# Patient Record
Sex: Female | Born: 1990 | Race: Black or African American | Hispanic: No | Marital: Single | Smoking: Never smoker
Health system: Southern US, Community
[De-identification: ages and names within clinical notes are randomized; demographics above are authoritative.]

## PROBLEM LIST (undated history)

## (undated) DIAGNOSIS — F410 Panic disorder [episodic paroxysmal anxiety] without agoraphobia: Secondary | ICD-10-CM

## (undated) DIAGNOSIS — J189 Pneumonia, unspecified organism: Secondary | ICD-10-CM

---
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
## 2010-09-16 LAB — COMPREHENSIVE METABOLIC PANEL
ALT: 20 U/L (ref 0–35)
Albumin: 4.3 g/dL (ref 3.5–5.2)
BUN: 13 mg/dL (ref 6–23)
Calcium: 9.7 mg/dL (ref 8.4–10.5)
Glucose, Bld: 91 mg/dL (ref 70–99)
Sodium: 136 mEq/L (ref 135–145)
Total Protein: 8.2 g/dL (ref 6.0–8.3)

## 2010-09-16 LAB — DIFFERENTIAL
Lymphs Abs: 1.3 10*3/uL (ref 0.7–4.0)
Monocytes Absolute: 0.3 10*3/uL (ref 0.1–1.0)
Monocytes Relative: 3 % (ref 3–12)
Neutro Abs: 7.6 10*3/uL (ref 1.7–7.7)
Neutrophils Relative %: 82 % — ABNORMAL HIGH (ref 43–77)

## 2010-09-16 LAB — URINALYSIS, ROUTINE W REFLEX MICROSCOPIC
Nitrite: NEGATIVE
Specific Gravity, Urine: 1.033 — ABNORMAL HIGH (ref 1.005–1.030)
pH: 6 (ref 5.0–8.0)

## 2010-09-16 LAB — URINE MICROSCOPIC-ADD ON

## 2010-09-16 LAB — CBC
Hemoglobin: 12.7 g/dL (ref 12.0–15.0)
MCHC: 32.8 g/dL (ref 30.0–36.0)
Platelets: 326 10*3/uL (ref 150–400)
RDW: 15.7 % — ABNORMAL HIGH (ref 11.5–15.5)

## 2010-09-16 LAB — PREGNANCY, URINE: Preg Test, Ur: NEGATIVE

## 2012-03-11 ENCOUNTER — Emergency Department (HOSPITAL_COMMUNITY)
Admission: EM | Admit: 2012-03-11 | Discharge: 2012-03-11 | Disposition: A | Discharge status: Home / Self Care | Payer: Self-pay | Attending: Emergency Medicine | Admitting: Emergency Medicine

## 2012-03-11 ENCOUNTER — Encounter (HOSPITAL_COMMUNITY): Payer: Self-pay | Admitting: Emergency Medicine

## 2012-03-11 DIAGNOSIS — F410 Panic disorder [episodic paroxysmal anxiety] without agoraphobia: Secondary | ICD-10-CM | POA: Insufficient documentation

## 2012-03-11 HISTORY — DX: Panic disorder (episodic paroxysmal anxiety): F41.0

## 2012-03-11 NOTE — ED Notes (Signed)
Pt lying in bed, denies stressors. Called EMS earlier today but refused transport. Took Clonipine w/o relief so called EMS again. BP - 140/72, HR - 98, Resp - 22,  O2 SAT 100%, lung sounds clear and equal, w/o other Sx. No treatment PTA

## 2012-03-11 NOTE — ED Notes (Signed)
Discharge instructions reviewed w/ pt., verbalizes understanding. No prescriptions provided at discharge. 

## 2012-03-11 NOTE — ED Notes (Signed)
JYN:[id number]<AO> Expected date:<BR> Expected time:<BR> Means of arrival:<BR> Comments:<BR> EMS-[employer] student with anxiety attack

## 2012-03-11 NOTE — ED Provider Notes (Signed)
History     CSN: [id number]  Arrival date & time 03/11/12  0001   First MD Initiated Contact with Patient 03/11/12 0051      Chief Complaint  Patient presents with  . Panic Attack     The history is provided by the patient.   patient reports she takes when necessary Klonopin for anxiety reports that she had an anxiety attack and was described as shortness of breath and some sense of feeling of doom.  She is a long-standing history of panic attacks and states this was consistent with her priors.  She didn't denies homicidal or suicidal thoughts.  She has no other complaints at this time.  She reports that by the time she arrived the emergency department she felt much better.  She is requesting change in her dosage of Klonopin.  She has not called her psychiatrist regarding her increasing anxiety attacks.  She is a currently a Archivist.  Past Medical History  Diagnosis Date  . Panic attacks     History reviewed. No pertinent past surgical history.  No family history on file.  History  Substance Use Topics  . Smoking status: Never Smoker   . Smokeless tobacco: Never Used  . Alcohol Use: Yes     wine coolers every couple weeks    OB History    Grav Para Term Preterm Abortions TAB SAB Ect Mult Living                  Review of Systems  All other systems reviewed and are negative.    Allergies  Other  Home Medications   Current Outpatient Rx  Name Route Sig Dispense Refill  . CETIRIZINE HCL 10 MG PO TABS Oral Take 10 mg by mouth daily.    Marland Kitchen CLONAZEPAM 0.5 MG PO TABS Oral Take 0.5 mg by mouth daily as needed. For anxiety    . MEDROXYPROGESTERONE ACETATE 150 MG/ML IM SUSP Intramuscular Inject 150 mg into the muscle every 3 (three) months.      BP 130/80  Pulse 89  Temp 98.2 F (36.8 C) (Oral)  Resp 12  SpO2 99%  Physical Exam  Nursing note and vitals reviewed. Constitutional: She is oriented to person, place, and time. She appears well-developed and  well-nourished. No distress.  HENT:  Head: Normocephalic and atraumatic.  Eyes: EOM are normal.  Neck: Normal range of motion.  Cardiovascular: Normal rate, regular rhythm and normal heart sounds.   Pulmonary/Chest: Effort normal and breath sounds normal.  Abdominal: Soft. She exhibits no distension. There is no tenderness.  Musculoskeletal: Normal range of motion.  Neurological: She is alert and oriented to person, place, and time.  Skin: Skin is warm and dry.  Psychiatric: She has a normal mood and affect. Her behavior is normal. Judgment and thought content normal. Her mood appears not anxious. Her affect is not angry. Her speech is not rapid and/or pressured. Cognition and memory are normal. She does not exhibit a depressed mood. She expresses no homicidal and no suicidal ideation.    ED Course  Procedures (including critical care time)  Labs Reviewed - No data to display No results found.   1. Panic attack       MDM  Appears to be another panic attack.  I recommended outpatient followup.  No SI or HI.  No indication for evaluation by psychiatry in the ER.        Lyanne Co, MD 03/11/12 [phone]

## 2012-03-11 NOTE — ED Notes (Signed)
Pt states she had a panic attack, which she has had before, Rx'ed Clonipine which she took w/o relief. States "I couldn't breath and I started shaking all over"

## 2012-05-04 ENCOUNTER — Emergency Department (HOSPITAL_COMMUNITY): Payer: BC Managed Care – PPO

## 2012-05-04 ENCOUNTER — Emergency Department (HOSPITAL_COMMUNITY)
Admission: EM | Admit: 2012-05-04 | Discharge: 2012-05-04 | Disposition: A | Discharge status: Home / Self Care | Payer: BC Managed Care – PPO | Attending: Emergency Medicine | Admitting: Emergency Medicine

## 2012-05-04 ENCOUNTER — Encounter (HOSPITAL_COMMUNITY): Payer: Self-pay | Admitting: Emergency Medicine

## 2012-05-04 DIAGNOSIS — Z79899 Other long term (current) drug therapy: Secondary | ICD-10-CM | POA: Insufficient documentation

## 2012-05-04 DIAGNOSIS — Y92009 Unspecified place in unspecified non-institutional (private) residence as the place of occurrence of the external cause: Secondary | ICD-10-CM | POA: Insufficient documentation

## 2012-05-04 DIAGNOSIS — F410 Panic disorder [episodic paroxysmal anxiety] without agoraphobia: Secondary | ICD-10-CM | POA: Insufficient documentation

## 2012-05-04 DIAGNOSIS — R296 Repeated falls: Secondary | ICD-10-CM | POA: Insufficient documentation

## 2012-05-04 DIAGNOSIS — Y9389 Activity, other specified: Secondary | ICD-10-CM | POA: Insufficient documentation

## 2012-05-04 DIAGNOSIS — S93409A Sprain of unspecified ligament of unspecified ankle, initial encounter: Secondary | ICD-10-CM

## 2012-05-04 MED ORDER — OXYCODONE-ACETAMINOPHEN 5-325 MG PO TABS
1.0000 | ORAL_TABLET | Freq: Once | ORAL | Status: AC
  Administered 2012-05-04: 1 via ORAL
  Filled 2012-05-04: qty 1

## 2012-05-04 MED ORDER — IBUPROFEN 600 MG PO TABS: 600.0000 mg | ORAL_TABLET | Freq: Four times a day (QID) | ORAL | Status: DC | PRN

## 2012-05-04 MED ORDER — IBUPROFEN 800 MG PO TABS
800.0000 mg | ORAL_TABLET | Freq: Once | ORAL | Status: AC
  Administered 2012-05-04: 800 mg via ORAL
  Filled 2012-05-04: qty 1
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
## 2012-05-04 NOTE — Progress Notes (Signed)
Orthopedic Tech Progress Note Patient Details:  Diane Vargas 06-06-1991 [id number] Ankle air cast splint applied to Left LE, patient also fitted with crutches.  Ortho Devices Type of Ortho Device: Crutches;Ankle Air splint Ortho Device/Splint Location: Left LE Ortho Device/Splint Interventions: Application   Asia R Thompson 05/04/2012, 9:50 AM
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
## 2012-09-02 ENCOUNTER — Emergency Department (HOSPITAL_COMMUNITY)
Admission: EM | Admit: 2012-09-02 | Discharge: 2012-09-02 | Disposition: A | Discharge status: Home / Self Care | Payer: Self-pay | Attending: Emergency Medicine | Admitting: Emergency Medicine

## 2012-09-02 ENCOUNTER — Emergency Department (HOSPITAL_COMMUNITY): Payer: Self-pay

## 2012-09-02 ENCOUNTER — Encounter (HOSPITAL_COMMUNITY): Payer: Self-pay | Admitting: Emergency Medicine

## 2012-09-02 DIAGNOSIS — R05 Cough: Secondary | ICD-10-CM | POA: Insufficient documentation

## 2012-09-02 DIAGNOSIS — R630 Anorexia: Secondary | ICD-10-CM | POA: Insufficient documentation

## 2012-09-02 DIAGNOSIS — Z79899 Other long term (current) drug therapy: Secondary | ICD-10-CM | POA: Insufficient documentation

## 2012-09-02 DIAGNOSIS — R062 Wheezing: Secondary | ICD-10-CM | POA: Insufficient documentation

## 2012-09-02 DIAGNOSIS — F410 Panic disorder [episodic paroxysmal anxiety] without agoraphobia: Secondary | ICD-10-CM | POA: Insufficient documentation

## 2012-09-02 DIAGNOSIS — J189 Pneumonia, unspecified organism: Secondary | ICD-10-CM | POA: Insufficient documentation

## 2012-09-02 DIAGNOSIS — R059 Cough, unspecified: Secondary | ICD-10-CM | POA: Insufficient documentation

## 2012-09-02 DIAGNOSIS — R0789 Other chest pain: Secondary | ICD-10-CM | POA: Insufficient documentation

## 2012-09-02 DIAGNOSIS — R5381 Other malaise: Secondary | ICD-10-CM | POA: Insufficient documentation

## 2012-09-02 DIAGNOSIS — R509 Fever, unspecified: Secondary | ICD-10-CM | POA: Insufficient documentation

## 2012-09-02 DIAGNOSIS — J3489 Other specified disorders of nose and nasal sinuses: Secondary | ICD-10-CM | POA: Insufficient documentation

## 2012-09-02 DIAGNOSIS — IMO0001 Reserved for inherently not codable concepts without codable children: Secondary | ICD-10-CM | POA: Insufficient documentation

## 2012-09-02 HISTORY — DX: Panic disorder (episodic paroxysmal anxiety): F41.0

## 2012-09-02 MED ORDER — AZITHROMYCIN 250 MG PO TABS: 250.0000 mg | ORAL_TABLET | Freq: Every day | ORAL | Status: DC

## 2012-09-02 MED ORDER — ALBUTEROL SULFATE (5 MG/ML) 0.5% IN NEBU
5.0000 mg | INHALATION_SOLUTION | Freq: Once | RESPIRATORY_TRACT | Status: AC
  Administered 2012-09-02: 5 mg via RESPIRATORY_TRACT

## 2012-09-02 MED ORDER — DEXAMETHASONE SODIUM PHOSPHATE 10 MG/ML IJ SOLN
10.0000 mg | Freq: Once | INTRAMUSCULAR | Status: AC
  Administered 2012-09-02: 10 mg via INTRAMUSCULAR
  Filled 2012-09-02: qty 1

## 2012-09-02 MED ORDER — ALBUTEROL SULFATE (5 MG/ML) 0.5% IN NEBU
INHALATION_SOLUTION | RESPIRATORY_TRACT | Status: AC
  Filled 2012-09-02: qty 1

## 2012-09-02 NOTE — ED Provider Notes (Signed)
I saw  the patient, reviewed the resident's note and I agree with the findings and plan.   .Face to face Exam:  General:  Awake HEENT:  Atraumatic Resp:  Normal effort Abd:  Nondistended Neuro:No focal weakness   Nelia Shi, MD 09/02/12 2055

## 2012-09-02 NOTE — ED Notes (Signed)
PT. REPORTS SOB WITH PRODUCTIVE COUGH ONSET THIS EVENING , DENIES FEVER OR CHILLS.

## 2012-09-02 NOTE — ED Provider Notes (Signed)
History     CSN: [id number]  Arrival date & time 09/02/12  1904   First MD Initiated Contact with Patient 09/02/12 1944      Chief Complaint  Patient presents with  . Shortness of Breath    (Consider location/radiation/quality/duration/timing/severity/associated sxs/prior treatment) Patient is a 22 y.o. female presenting with shortness of breath.  Shortness of Breath Associated symptoms: cough, fever and wheezing   Associated symptoms: no abdominal pain, no neck pain and no vomiting    Ms. Diane Vargas 22yo F with pmh of panic attacks presents today for SOB and cough. Pt states had a panic attack today was able to take medication and slept with relief but woke up still some SOB. SOB was not sudden onset but has been gradually getting worse for the past week. Pt has had productive cough and subjective chills, anorexia, and myalgias for past week. She took some OTC allergy medication with limited relief. Pt has no hx of pulmonary disease. Pt is not a smoker. Pt lives in the dorms with several sick contacts with similar symptoms.   Past Medical History  Diagnosis Date  . Panic attacks   . Panic attack     History reviewed. No pertinent past surgical history.  No family history on file.  History  Substance Use Topics  . Smoking status: Never Smoker   . Smokeless tobacco: Never Used  . Alcohol Use: Yes     Comment: wine coolers every couple weeks    OB History   Grav Para Term Preterm Abortions TAB SAB Ect Mult Living                  Review of Systems  Constitutional: Positive for fever and fatigue. Negative for chills.  HENT: Positive for congestion, rhinorrhea and sneezing. Negative for neck pain and sinus pressure.   Respiratory: Positive for cough, chest tightness, shortness of breath and wheezing.   Gastrointestinal: Negative for nausea, vomiting, abdominal pain and diarrhea.  Genitourinary: Negative for dysuria.  Musculoskeletal: Positive for myalgias.    Allergies   Other and Watermelon flavor  Home Medications   Current Outpatient Rx  Name  Route  Sig  Dispense  Refill  . cetirizine (ZYRTEC) 10 MG tablet   Oral   Take 10 mg by mouth daily.         . clonazePAM (KLONOPIN) 0.5 MG tablet   Oral   Take 0.5 mg by mouth daily as needed. For anxiety           BP 118/74  Pulse 69  Temp(Src) 98.2 F (36.8 C) (Oral)  Resp 18  SpO2 98%  LMP 08/18/2012  Physical Exam  Constitutional: She is oriented to person, place, and time. She appears well-developed and well-nourished. No distress.  Eyes: Conjunctivae are normal. Pupils are equal, round, and reactive to light.  Cardiovascular: Normal rate, regular rhythm and normal heart sounds.   Pulmonary/Chest: Effort normal. No respiratory distress. She has wheezes. She has no rales.  Abdominal: Soft. She exhibits no distension. There is no tenderness. There is no rebound and no guarding.  Neurological: She is alert and oriented to person, place, and time.  Skin: Skin is warm. She is not diaphoretic.    ED Course  Procedures (including critical care time)  Labs Reviewed - No data to display Dg Chest 2 View  09/02/2012  *RADIOLOGY REPORT*  Clinical Data: Shortness of breath, chest pain  CHEST - 2 VIEW  Comparison: None.  Findings: Cardiomediastinal silhouette is  unremarkable.  No acute infiltrate or pleural effusion.  No pulmonary edema.  Bony thorax is unremarkable.  IMPRESSION: No active disease.   Original Report Authenticated By: Natasha Mead, M.D.      No diagnosis found.    MDM  Pt presented with wheezes and given nebulizer treatments with some improvement but still some wheezes on exam. Pt was never hypoxic. PERC score of zero. CXR no acute cardiopulmonary process. Pt sister had diagnosis of CAP with similar symptoms. Pt was given decadron 10mg  while in ED and d/c with Zpak.   Pt was seen and discussed with Dr. Linna Darner, MD 09/02/12 2039

## 2012-09-03 ENCOUNTER — Telehealth (HOSPITAL_COMMUNITY): Payer: Self-pay | Admitting: Emergency Medicine

## 2012-09-06 ENCOUNTER — Emergency Department (HOSPITAL_COMMUNITY)
Admission: EM | Admit: 2012-09-06 | Discharge: 2012-09-06 | Disposition: A | Discharge status: Home / Self Care | Payer: BC Managed Care – PPO | Source: Home / Self Care | Attending: Family Medicine | Admitting: Family Medicine

## 2012-09-06 ENCOUNTER — Encounter (HOSPITAL_COMMUNITY): Payer: Self-pay | Admitting: *Deleted

## 2012-09-06 DIAGNOSIS — H109 Unspecified conjunctivitis: Secondary | ICD-10-CM

## 2012-09-06 HISTORY — DX: Pneumonia, unspecified organism: J18.9

## 2012-09-06 MED ORDER — CETIRIZINE HCL 10 MG PO TABS: 10.0000 mg | ORAL_TABLET | Freq: Every day | ORAL | Status: DC

## 2012-09-06 MED ORDER — TOBRAMYCIN 0.3 % OP SOLN: 1.0000 [drp] | Freq: Four times a day (QID) | OPHTHALMIC | Status: DC

## 2012-09-06 NOTE — ED Provider Notes (Signed)
History     CSN: [id number]  Arrival date & time 09/06/12  1128   First MD Initiated Contact with Patient 09/06/12 1131      Chief Complaint  Patient presents with  . Conjunctivitis    (Consider location/radiation/quality/duration/timing/severity/associated sxs/prior treatment) Patient is a 22 y.o. female presenting with conjunctivitis. The history is provided by the patient.  Conjunctivitis  The current episode started 2 days ago. The onset was gradual. The problem has been gradually worsening. The problem is mild. Associated symptoms include eye itching, congestion, eye discharge and eye redness. Pertinent negatives include no fever, no decreased vision, no photophobia and no eye pain.    Past Medical History  Diagnosis Date  . Panic attacks   . Panic attack   . Pneumonia     History reviewed. No pertinent past surgical history.  No family history on file.  History  Substance Use Topics  . Smoking status: Never Smoker   . Smokeless tobacco: Never Used  . Alcohol Use: Yes     Comment: occasionally    OB History   Grav Para Term Preterm Abortions TAB SAB Ect Mult Living                  Review of Systems  Constitutional: Negative for fever.  HENT: Positive for congestion.   Eyes: Positive for discharge, redness and itching. Negative for photophobia and pain.    Allergies  Other and Watermelon flavor  Home Medications   Current Outpatient Rx  Name  Route  Sig  Dispense  Refill  . azithromycin (ZITHROMAX Z-PAK) 250 MG tablet   Oral   Take 1 tablet (250 mg total) by mouth daily. Take 500mg  or 2 tablets the first day and then 250mg  or 1 tablet once daily for 4 days   6 each   0   . clonazePAM (KLONOPIN) 0.5 MG tablet   Oral   Take 0.5 mg by mouth daily as needed. For anxiety         . cetirizine (ZYRTEC) 10 MG tablet   Oral   Take 10 mg by mouth daily.         . cetirizine (ZYRTEC) 10 MG tablet   Oral   Take 1 tablet (10 mg total) by mouth  daily. One tab daily for allergies   30 tablet   1   . tobramycin (TOBREX) 0.3 % ophthalmic solution   Both Eyes   Place 1 drop into both eyes every 6 (six) hours. After warm soak to eyes   5 mL   0     BP 135/90  Pulse 79  Temp(Src) 98.2 F (36.8 C) (Oral)  Resp 16  SpO2 100%  LMP 08/09/2012  Physical Exam  Nursing note and vitals reviewed. Constitutional: She is oriented to person, place, and time. She appears well-developed and well-nourished.  HENT:  Head: Normocephalic.  Right Ear: External ear normal.  Left Ear: External ear normal.  Mouth/Throat: Oropharynx is clear and moist.  Eyes: EOM are normal. Pupils are equal, round, and reactive to light. Right eye exhibits discharge. Left eye exhibits discharge. Right conjunctiva is injected. Left conjunctiva is injected.  Neck: Normal range of motion. Neck supple.  Lymphadenopathy:    She has no cervical adenopathy.  Neurological: She is alert and oriented to person, place, and time.  Skin: Skin is warm and dry.  Psychiatric: She has a normal mood and affect.    ED Course  Procedures (including critical care time)  Labs Reviewed - No data to display No results found.   1. Conjunctivitis of both eyes       MDM          Linna Hoff, MD 09/06/12 1248

## 2012-09-06 NOTE — ED Notes (Signed)
C/O left eye swelling and redness since Thurs; woke up on Friday with crusting.  Yesterday sxs also started in right eye.  Does not wear contact lenses.  Has been using cool compresses and flushing with epsom salt solution.  Pt is currently being treated with Z-pack for pneumomia since Tue.

## 2012-09-09 ENCOUNTER — Emergency Department (INDEPENDENT_AMBULATORY_CARE_PROVIDER_SITE_OTHER)
Admission: EM | Admit: 2012-09-09 | Discharge: 2012-09-09 | Disposition: A | Discharge status: Home / Self Care | Payer: BC Managed Care – PPO | Source: Home / Self Care | Attending: Family Medicine | Admitting: Family Medicine

## 2012-09-09 ENCOUNTER — Encounter (HOSPITAL_COMMUNITY): Payer: Self-pay | Admitting: Emergency Medicine

## 2012-09-09 DIAGNOSIS — J309 Allergic rhinitis, unspecified: Secondary | ICD-10-CM

## 2012-09-09 DIAGNOSIS — H1013 Acute atopic conjunctivitis, bilateral: Secondary | ICD-10-CM

## 2012-09-09 DIAGNOSIS — H1010 Acute atopic conjunctivitis, unspecified eye: Secondary | ICD-10-CM

## 2012-09-09 MED ORDER — PREDNISOLONE SODIUM PHOSPHATE 1 % OP SOLN: 1.0000 [drp] | Freq: Four times a day (QID) | OPHTHALMIC | Status: DC

## 2012-09-09 NOTE — ED Notes (Addendum)
Pt c/o red and swollen eyes x 5 days. Was seen here Thursday and prescribed eye drops and allergy medicine with no relief. This morning eyelids were stuck together with trouble opening. Blurred vision in right eye. Patient is alert and oriented. Denies fever.

## 2012-09-09 NOTE — ED Provider Notes (Signed)
History     CSN: [id number]  Arrival date & time 09/09/12  1000   First MD Initiated Contact with Patient 09/09/12 1006      Chief Complaint  Patient presents with  . Conjunctivitis    (Consider location/radiation/quality/duration/timing/severity/associated sxs/prior treatment) Patient is a 22 y.o. female presenting with conjunctivitis. The history is provided by the patient.  Conjunctivitis  The current episode started 3 to 5 days ago. The problem has been unchanged. The problem is mild. Associated symptoms include eye itching, congestion, rhinorrhea and eye redness. Pertinent negatives include no fever, no eye discharge and no eye pain.    Past Medical History  Diagnosis Date  . Panic attacks   . Panic attack   . Pneumonia     History reviewed. No pertinent past surgical history.  No family history on file.  History  Substance Use Topics  . Smoking status: Never Smoker   . Smokeless tobacco: Never Used  . Alcohol Use: Yes     Comment: occasionally    OB History   Grav Para Term Preterm Abortions TAB SAB Ect Mult Living                  Review of Systems  Constitutional: Negative.  Negative for fever.  HENT: Positive for congestion, rhinorrhea, sneezing and postnasal drip.   Eyes: Positive for redness and itching. Negative for pain and discharge.    Allergies  Other and Watermelon flavor  Home Medications   Current Outpatient Rx  Name  Route  Sig  Dispense  Refill  . azithromycin (ZITHROMAX Z-PAK) 250 MG tablet   Oral   Take 1 tablet (250 mg total) by mouth daily. Take 500mg  or 2 tablets the first day and then 250mg  or 1 tablet once daily for 4 days   6 each   0   . cetirizine (ZYRTEC) 10 MG tablet   Oral   Take 10 mg by mouth daily.         . cetirizine (ZYRTEC) 10 MG tablet   Oral   Take 1 tablet (10 mg total) by mouth daily. One tab daily for allergies   30 tablet   1   . clonazePAM (KLONOPIN) 0.5 MG tablet   Oral   Take 0.5 mg by mouth  daily as needed. For anxiety         . prednisoLONE sodium phosphate (INFLAMASE FORTE) 1 % ophthalmic solution   Both Eyes   Place 1 drop into both eyes 4 (four) times daily. For 5 days   10 mL   0   . tobramycin (TOBREX) 0.3 % ophthalmic solution   Both Eyes   Place 1 drop into both eyes every 6 (six) hours. After warm soak to eyes   5 mL   0     BP 123/79  Pulse 96  Temp(Src) 98.5 F (36.9 C) (Oral)  SpO2 100%  LMP 08/09/2012  Physical Exam  Nursing note and vitals reviewed. Constitutional: She appears well-developed and well-nourished.  HENT:  Head: Normocephalic.  Right Ear: External ear normal.  Left Ear: External ear normal.  Mouth/Throat: Oropharynx is clear and moist.  Eyes: EOM are normal. Pupils are equal, round, and reactive to light. Right eye exhibits no discharge. Left eye exhibits no discharge. Right conjunctiva is injected. Left conjunctiva is injected.    ED Course  Procedures (including critical care time)  Labs Reviewed - No data to display No results found.   1. Allergic conjunctivitis and  rhinitis, bilateral       MDM          Linna Hoff, MD 09/09/12 1034

## 2013-02-24 ENCOUNTER — Emergency Department (HOSPITAL_COMMUNITY): Payer: BC Managed Care – PPO

## 2013-02-24 ENCOUNTER — Emergency Department (HOSPITAL_COMMUNITY)
Admission: EM | Admit: 2013-02-24 | Discharge: 2013-02-24 | Disposition: A | Discharge status: Home / Self Care | Payer: BC Managed Care – PPO | Attending: Emergency Medicine | Admitting: Emergency Medicine

## 2013-02-24 ENCOUNTER — Encounter (HOSPITAL_COMMUNITY): Payer: Self-pay | Admitting: Emergency Medicine

## 2013-02-24 DIAGNOSIS — F410 Panic disorder [episodic paroxysmal anxiety] without agoraphobia: Secondary | ICD-10-CM | POA: Insufficient documentation

## 2013-02-24 DIAGNOSIS — Z3202 Encounter for pregnancy test, result negative: Secondary | ICD-10-CM | POA: Insufficient documentation

## 2013-02-24 DIAGNOSIS — Z79899 Other long term (current) drug therapy: Secondary | ICD-10-CM | POA: Insufficient documentation

## 2013-02-24 DIAGNOSIS — K8020 Calculus of gallbladder without cholecystitis without obstruction: Secondary | ICD-10-CM

## 2013-02-24 DIAGNOSIS — Z8701 Personal history of pneumonia (recurrent): Secondary | ICD-10-CM | POA: Insufficient documentation

## 2013-02-24 DIAGNOSIS — R1110 Vomiting, unspecified: Secondary | ICD-10-CM | POA: Insufficient documentation

## 2013-02-24 DIAGNOSIS — M546 Pain in thoracic spine: Secondary | ICD-10-CM | POA: Insufficient documentation

## 2013-02-24 LAB — CBC WITH DIFFERENTIAL/PLATELET
Basophils Relative: 0 % (ref 0–1)
Hemoglobin: 11.7 g/dL — ABNORMAL LOW (ref 12.0–15.0)
MCHC: 33.7 g/dL (ref 30.0–36.0)
Monocytes Relative: 6 % (ref 3–12)
Neutro Abs: 4.3 10*3/uL (ref 1.7–7.7)
Neutrophils Relative %: 63 % (ref 43–77)
Platelets: 336 10*3/uL (ref 150–400)
RBC: 4.26 MIL/uL (ref 3.87–5.11)

## 2013-02-24 LAB — COMPREHENSIVE METABOLIC PANEL
ALT: 12 U/L (ref 0–35)
AST: 17 U/L (ref 0–37)
Albumin: 3.3 g/dL — ABNORMAL LOW (ref 3.5–5.2)
Alkaline Phosphatase: 60 U/L (ref 39–117)
BUN: 11 mg/dL (ref 6–23)
Chloride: 103 mEq/L (ref 96–112)
Potassium: 4 mEq/L (ref 3.5–5.1)
Sodium: 137 mEq/L (ref 135–145)
Total Bilirubin: 0.1 mg/dL — ABNORMAL LOW (ref 0.3–1.2)
Total Protein: 7.1 g/dL (ref 6.0–8.3)

## 2013-02-24 LAB — URINALYSIS, ROUTINE W REFLEX MICROSCOPIC
Bilirubin Urine: NEGATIVE
Glucose, UA: NEGATIVE mg/dL
Hgb urine dipstick: NEGATIVE
Ketones, ur: NEGATIVE mg/dL
Specific Gravity, Urine: 1.006 (ref 1.005–1.030)
pH: 6 (ref 5.0–8.0)

## 2013-02-24 LAB — LIPASE, BLOOD: Lipase: 30 U/L (ref 11–59)

## 2013-02-24 LAB — URINE MICROSCOPIC-ADD ON

## 2013-02-24 MED ORDER — HYDROMORPHONE HCL PF 1 MG/ML IJ SOLN
1.0000 mg | Freq: Once | INTRAMUSCULAR | Status: AC
  Administered 2013-02-24: 1 mg via INTRAVENOUS
  Filled 2013-02-24: qty 1

## 2013-02-24 MED ORDER — SODIUM CHLORIDE 0.9 % IV BOLUS (SEPSIS)
1000.0000 mL | Freq: Once | INTRAVENOUS | Status: AC
  Administered 2013-02-24: 1000 mL via INTRAVENOUS

## 2013-02-24 MED ORDER — ONDANSETRON HCL 4 MG/2ML IJ SOLN
4.0000 mg | Freq: Once | INTRAMUSCULAR | Status: AC
  Administered 2013-02-24: 4 mg via INTRAVENOUS
  Filled 2013-02-24: qty 2

## 2013-02-24 MED ORDER — OXYCODONE-ACETAMINOPHEN 5-325 MG PO TABS: 2.0000 | ORAL_TABLET | ORAL | Status: DC | PRN

## 2013-02-24 NOTE — ED Notes (Signed)
Patient presents to ED with complaints of back pain, abdominal pain, and nausea  Since this am.

## 2013-02-24 NOTE — ED Provider Notes (Signed)
CSN: [id number]     Arrival date & time 02/24/13  8469 History   First MD Initiated Contact with Patient 02/24/13 [phone]     Chief Complaint  Patient presents with  . Abdominal Pain   (Consider location/radiation/quality/duration/timing/severity/associated sxs/prior Treatment) Patient is a 22 y.o. female presenting with abdominal pain.  Abdominal Pain  Pt reports she woke up this morning with severe aching upper back and upper abdominal pain. She had one episode of vomiting, non-bloody. She reports some blood on tissue after hard stool yesterday but no melena or bloody stools. She denies fever. No dysuria, hematuria, vaginal bleeding or discharge. LMP was a month ago. She denies history of pregnancy, miscarriage or ectopic. She denies history of gall stones. She had some EtOH the past weekend but is not a daily drinker.   Past Medical History  Diagnosis Date  . Panic attacks   . Panic attack   . Pneumonia    History reviewed. No pertinent past surgical history. History reviewed. No pertinent family history. History  Substance Use Topics  . Smoking status: Never Smoker   . Smokeless tobacco: Never Used  . Alcohol Use: Yes     Comment: occasionally   OB History   Grav Para Term Preterm Abortions TAB SAB Ect Mult Living                 Review of Systems  Gastrointestinal: Positive for abdominal pain.   All other systems reviewed and are negative except as noted in HPI.   Allergies  Nickel; Other; and Watermelon flavor  Home Medications   Current Outpatient Rx  Name  Route  Sig  Dispense  Refill  . cetirizine (ZYRTEC) 10 MG tablet   Oral   Take 10 mg by mouth daily.         . clonazePAM (KLONOPIN) 0.5 MG tablet   Oral   Take 0.5 mg by mouth daily as needed. For anxiety         . PRESCRIPTION MEDICATION   Oral   Take 1 tablet by mouth daily.          BP 127/87  Pulse 91  Temp(Src) 98.3 F (36.8 C) (Oral)  Resp 18  Ht 5\' 8"  (1.727 m)  Wt 175 lb (79.379  kg)  BMI 26.61 kg/m2  SpO2 99% Physical Exam  Nursing note and vitals reviewed. Constitutional: She is oriented to person, place, and time. She appears well-developed and well-nourished.  HENT:  Head: Normocephalic and atraumatic.  Eyes: EOM are normal. Pupils are equal, round, and reactive to light.  Neck: Normal range of motion. Neck supple.  Cardiovascular: Normal rate, normal heart sounds and intact distal pulses.   Pulmonary/Chest: Effort normal and breath sounds normal.  Abdominal: Bowel sounds are normal. She exhibits no distension. There is tenderness (epigastric and RUQ). There is no rebound and no guarding (neg Murphy's sign).  Musculoskeletal: Normal range of motion. She exhibits no edema and no tenderness.  Neurological: She is alert and oriented to person, place, and time. She has normal strength. No cranial nerve deficit or sensory deficit.  Skin: Skin is warm and dry. No rash noted.  Psychiatric: She has a normal mood and affect.    ED Course  Procedures (including critical care time) Labs Review Labs Reviewed  CBC WITH DIFFERENTIAL - Abnormal; Notable for the following:    Hemoglobin 11.7 (*)    HCT 34.7 (*)    All other components within normal limits  COMPREHENSIVE METABOLIC  PANEL - Abnormal; Notable for the following:    Glucose, Bld 100 (*)    Albumin 3.3 (*)    Total Bilirubin 0.1 (*)    GFR calc non Af Amer 89 (*)    All other components within normal limits  URINALYSIS, ROUTINE W REFLEX MICROSCOPIC - Abnormal; Notable for the following:    APPearance HAZY (*)    Leukocytes, UA TRACE (*)    All other components within normal limits  URINE MICROSCOPIC-ADD ON - Abnormal; Notable for the following:    Squamous Epithelial / LPF FEW (*)    Bacteria, UA FEW (*)    All other components within normal limits  URINE CULTURE  LIPASE, BLOOD  POCT PREGNANCY, URINE   Imaging Review US Abdomen Complete  02/24/2013   CLINICAL DATA:  Right upper quadrant and  epigastric pain.  EXAM: ABDOMEN ULTRASOUND  COMPARISON:  None.  FINDINGS: Gallbladder  Gallstones are seen, with the largest in the gallbladder neck measuring approximately 1.5 cm. No evidence of gallbladder wall thickening. No sonographic Murphy's sign noted by sonographer.  Common bile duct  Diameter: 5 mm.  Liver  No focal lesion identified. Within normal limits in parenchymal echogenicity.  IVC  No abnormality visualized.  Pancreas  Visualized portion unremarkable.  Spleen  Size and appearance within normal limits.  Right Kidney  Length: 10.7 cm. Echogenicity within normal limits. No mass or hydronephrosis visualized.  Left Kidney  Length: 10.9 cm. Echogenicity within normal limits. No mass or hydronephrosis visualized.  Abdominal aorta  No aneurysm visualized.  IMPRESSION: Cholelithiasis. No sonographic signs of cholecystitis, biliary dilatation, or other acute findings.   Electronically Signed   By: Myles Rosenthal   On: 02/24/2013 12:26    MDM   1. Cholelithiasis     Labs and imaging reviewed. Pt feeling much better. Most likely from gall stones, but does not have any signs of cholecystitis. Will d/c with follow up in Gen Surg clinic. Pain meds as needed.     Charles B. Bernette Mayers, MD 02/24/13 1245

## 2013-02-24 NOTE — ED Notes (Signed)
Patient states she is unable to void at this time.  

## 2013-02-25 LAB — URINE CULTURE: Colony Count: 9000

## 2013-03-09 ENCOUNTER — Other Ambulatory Visit (INDEPENDENT_AMBULATORY_CARE_PROVIDER_SITE_OTHER): Payer: Self-pay

## 2013-03-09 ENCOUNTER — Encounter (INDEPENDENT_AMBULATORY_CARE_PROVIDER_SITE_OTHER): Payer: Self-pay

## 2013-03-09 ENCOUNTER — Encounter (INDEPENDENT_AMBULATORY_CARE_PROVIDER_SITE_OTHER): Payer: Self-pay | Admitting: Surgery

## 2013-03-09 ENCOUNTER — Telehealth (INDEPENDENT_AMBULATORY_CARE_PROVIDER_SITE_OTHER): Payer: Self-pay | Admitting: Surgery

## 2013-03-09 ENCOUNTER — Ambulatory Visit (INDEPENDENT_AMBULATORY_CARE_PROVIDER_SITE_OTHER): Payer: BC Managed Care – PPO | Admitting: Surgery

## 2013-03-09 VITALS — BP 110/72 | HR 72 | Temp 98.7°F | Resp 14 | Ht 68.0 in | Wt 177.8 lb

## 2013-03-09 DIAGNOSIS — K8020 Calculus of gallbladder without cholecystitis without obstruction: Secondary | ICD-10-CM

## 2013-03-09 MED ORDER — IBUPROFEN 200 MG PO TABS: 800.0000 mg | ORAL_TABLET | Freq: Four times a day (QID) | ORAL | Status: DC | PRN

## 2013-03-09 NOTE — Progress Notes (Signed)
Patient ID: Diane Vargas, female   DOB: 11/14/1990, 22 y.o.   MRN: [id number]  No chief complaint on file.   HPI Diane Vargas is a 22 y.o. female.  Patient presents with chief complaint of epigastric abdominal pain. Present off and on for years she thinks. Seen recently in the emergency room her ultrasound showed gallstones. Pain is sharp in nature and radiates toward her back. It is under both rib cages. Some history of nausea and vomiting with the pain. HPI  Past Medical History  Diagnosis Date  . Panic attacks   . Panic attack   . Pneumonia     History reviewed. No pertinent past surgical history.  No family history on file.  Social History History  Substance Use Topics  . Smoking status: Never Smoker   . Smokeless tobacco: Never Used  . Alcohol Use: Yes     Comment: occasionally    Allergies  Allergen Reactions  . Nickel Other (See Comments)    Burns skin  . Other Other (See Comments)    Melon Reaction: tongue bleeds and peels, body rash.  Marland Kitchen Watermelon Flavor Rash and Other (See Comments)    Tongue bleeds and peels; ALL MELONS    Current Outpatient Prescriptions  Medication Sig Dispense Refill  . cetirizine (ZYRTEC) 10 MG tablet Take 10 mg by mouth daily.      . clonazePAM (KLONOPIN) 0.5 MG tablet Take 0.5 mg by mouth daily as needed. For anxiety      . oxyCODONE-acetaminophen (PERCOCET/ROXICET) 5-325 MG per tablet Take 2 tablets by mouth every 4 (four) hours as needed for pain.  15 tablet  0  . PRESCRIPTION MEDICATION Take 1 tablet by mouth daily.       No current facility-administered medications for this visit.    Review of Systems Review of Systems  Constitutional: Negative for fever, chills and unexpected weight change.  HENT: Negative for hearing loss, congestion, sore throat, trouble swallowing and voice change.   Eyes: Negative for visual disturbance.  Respiratory: Negative for cough and wheezing.   Cardiovascular: Negative for chest pain,  palpitations and leg swelling.  Gastrointestinal: Negative for nausea, vomiting, abdominal pain, diarrhea, constipation, blood in stool, abdominal distention and anal bleeding.  Genitourinary: Negative for hematuria, vaginal bleeding and difficulty urinating.  Musculoskeletal: Negative for arthralgias.  Skin: Negative for rash and wound.  Neurological: Negative for seizures, syncope and headaches.  Hematological: Negative for adenopathy. Does not bruise/bleed easily.  Psychiatric/Behavioral: Negative for confusion.    Blood pressure 110/72, pulse 72, temperature 98.7 F (37.1 C), resp. rate 14, height 5\' 8"  (1.727 m), weight 177 lb 12.8 oz (80.65 kg).  Physical Exam Physical Exam  Constitutional: She is oriented to person, place, and time. She appears well-developed and well-nourished.  HENT:  Head: Normocephalic and atraumatic.  Eyes: EOM are normal. Pupils are equal, round, and reactive to light.  Neck: Normal range of motion. Neck supple.  Cardiovascular: Normal rate and regular rhythm.   Pulmonary/Chest: Effort normal and breath sounds normal.  Abdominal: Soft. Bowel sounds are normal. She exhibits no distension. There is no tenderness. There is no rebound and no guarding.  Musculoskeletal: Normal range of motion.  Neurological: She is alert and oriented to person, place, and time.  Skin: Skin is warm and dry.  Psychiatric: She has a normal mood and affect. Her behavior is normal. Judgment and thought content normal.    Data Reviewed CLINICAL DATA: Right upper quadrant and epigastric pain.  EXAM:  ABDOMEN  ULTRASOUND  COMPARISON: None.  FINDINGS:  Gallbladder  Gallstones are seen, with the largest in the gallbladder neck  measuring approximately 1.5 cm. No evidence of gallbladder wall  thickening. No sonographic Murphy's sign noted by sonographer.  Common bile duct  Diameter: 5 mm.  Liver  No focal lesion identified. Within normal limits in parenchymal  echogenicity.   IVC  No abnormality visualized.  Pancreas  Visualized portion unremarkable.  Spleen  Size and appearance within normal limits.  Right Kidney  Length: 10.7 cm. Echogenicity within normal limits. No mass or  hydronephrosis visualized.  Left Kidney  Length: 10.9 cm. Echogenicity within normal limits. No mass or  hydronephrosis visualized.  Abdominal aorta  No aneurysm visualized.  IMPRESSION:  Cholelithiasis. No sonographic signs of cholecystitis, biliary  dilatation, or other acute findings.  Electronically Signed  By: Myles Rosenthal  On: 02/24/2013 12:26         Assessment    Symptomatic cholelithiasis    Plan    Recommend laparoscopic cholecystectomy with cholangiogram.The procedure has been discussed with the patient. Operative and non operative treatments have been discussed. Risks of surgery include bleeding, infection,  Common bile duct injury,  Injury to the stomach,liver, colon,small intestine, abdominal wall,  Diaphragm,  Major blood vessels,  And the need for an open procedure.  Other risks include worsening of medical problems, death,  DVT and pulmonary embolism, and cardiovascular events.   Medical options have also been discussed. The patient has been informed of long term expectations of surgery and non surgical options,  The patient agrees to proceed.         [name] 03/09/2013, 2:22 PM

## 2013-03-09 NOTE — Patient Instructions (Signed)

## 2013-03-09 NOTE — Addendum Note (Signed)
Addended by: Harriette Bouillon A on: 03/09/2013 02:41 PM   Modules accepted: Orders

## 2013-03-09 NOTE — Telephone Encounter (Signed)
Patient met with surgery scheduling,  Patient advised of financial responsibilities, face sheet put in pending

## 2013-04-28 ENCOUNTER — Emergency Department (HOSPITAL_COMMUNITY)
Admission: EM | Admit: 2013-04-28 | Discharge: 2013-04-28 | Disposition: A | Discharge status: Home / Self Care | Payer: BC Managed Care – PPO | Attending: Emergency Medicine | Admitting: Emergency Medicine

## 2013-04-28 ENCOUNTER — Encounter (HOSPITAL_COMMUNITY): Payer: Self-pay | Admitting: Emergency Medicine

## 2013-04-28 DIAGNOSIS — Z8701 Personal history of pneumonia (recurrent): Secondary | ICD-10-CM | POA: Insufficient documentation

## 2013-04-28 DIAGNOSIS — F419 Anxiety disorder, unspecified: Secondary | ICD-10-CM

## 2013-04-28 DIAGNOSIS — R21 Rash and other nonspecific skin eruption: Secondary | ICD-10-CM | POA: Insufficient documentation

## 2013-04-28 DIAGNOSIS — F411 Generalized anxiety disorder: Secondary | ICD-10-CM | POA: Insufficient documentation

## 2013-04-28 DIAGNOSIS — Z8719 Personal history of other diseases of the digestive system: Secondary | ICD-10-CM | POA: Insufficient documentation

## 2013-04-28 DIAGNOSIS — Z79899 Other long term (current) drug therapy: Secondary | ICD-10-CM | POA: Insufficient documentation

## 2013-04-28 DIAGNOSIS — Z3202 Encounter for pregnancy test, result negative: Secondary | ICD-10-CM | POA: Insufficient documentation

## 2013-04-28 LAB — PREGNANCY, URINE: Preg Test, Ur: NEGATIVE

## 2013-04-28 LAB — URINALYSIS, ROUTINE W REFLEX MICROSCOPIC
Bilirubin Urine: NEGATIVE
Hgb urine dipstick: NEGATIVE
Nitrite: NEGATIVE
Specific Gravity, Urine: 1.008 (ref 1.005–1.030)
Urobilinogen, UA: 0.2 mg/dL (ref 0.0–1.0)
pH: 7.5 (ref 5.0–8.0)

## 2013-04-28 NOTE — ED Notes (Signed)
Pt presents to ed with c/o panic attack/anxiety. Pt denies any specific factor causing it. Sts has hx of same and has taken her medications  this morning without relief.

## 2013-04-28 NOTE — Progress Notes (Signed)
[facility] provided pt with a list of primary care resources.  °

## 2013-04-28 NOTE — ED Provider Notes (Signed)
CSN: [id number]     Arrival date & time 04/28/13  1110 History   First MD Initiated Contact with Patient 04/28/13 1157     Chief Complaint  Patient presents with  . Panic Attack   (Consider location/radiation/quality/duration/timing/severity/associated sxs/prior Treatment) HPI Comments: The patient is a 22 year-old female with a past medical history of anxiety with panic attacks, Cholelithiasis, presenting the Emergency Department with a chief complaint of panic attack. 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  She reports symptoms resolved after taken KLONOPIN 0.5 mg. She denies drug of EtOH use, denies pregnancy, no fever or chills. She denies personal relationship stress. She currently asymptomatic in the ED.  She reports she has a therapist at school.  The prescription of klonopin was filled in [place], where she is from and where her family lives. She reports a non tender rash to left breast, without a history of trauma, prodrome, or nickle exposure.  Single episode of sharp suprapubic pain and urinary incontinence in the ED. Marland Kitchen      The history is provided by the patient. No language interpreter was used.    Past Medical History  Diagnosis Date  . Panic attacks   . Panic attack   . Pneumonia    History reviewed. No pertinent past surgical history. No family history on file. History  Substance Use Topics  . Smoking status: Never Smoker   . Smokeless tobacco: Never Used  . Alcohol Use: Yes     Comment: occasionally   OB History   Grav Para Term Preterm Abortions TAB SAB Ect Mult Living                 Review of Systems  All other systems reviewed and are negative.    Allergies  Nickel; Other; and Watermelon flavor  Home Medications   Current Outpatient Rx  Name  Route  Sig  Dispense  Refill  . cetirizine (ZYRTEC) 10 MG tablet   Oral   Take 10 mg by mouth daily as needed for  allergies.          . clonazePAM (KLONOPIN) 0.5 MG tablet   Oral   Take 0.5 mg by mouth daily as needed for anxiety. For anxiety         . ibuprofen (ADVIL,MOTRIN) 200 MG tablet   Oral   Take 800 mg by mouth every 6 (six) hours as needed for mild pain or moderate pain.         Marland Kitchen oxyCODONE-acetaminophen (PERCOCET/ROXICET) 5-325 MG per tablet   Oral   Take 2 tablets by mouth every 4 (four) hours as needed for moderate pain or severe pain.          BP 131/87  Pulse 86  Temp(Src) 98.5 F (36.9 C) (Oral)  Resp 16  SpO2 98%  LMP 04/14/2013 Physical Exam  Nursing note and vitals reviewed. Constitutional: She is oriented to person, place, and time. She appears well-developed and well-nourished. No distress.  HENT:  Head: Normocephalic and atraumatic.  Eyes: EOM are normal. Pupils are equal, round, and reactive to light. No scleral icterus.  Neck: Neck supple.  Cardiovascular: Normal rate, regular rhythm and normal heart sounds.   No murmur heard. Pulmonary/Chest: Effort normal and breath sounds normal. She has no wheezes.  Abdominal: Soft. Bowel sounds are normal. There is no tenderness. There is no rebound and no guarding.  Musculoskeletal: Normal range of motion. She  exhibits no edema.  Neurological: She is alert and oriented to person, place, and time.  Skin: Skin is warm and dry. Rash noted. She is not diaphoretic.  superior left breast with 2x2 cm area of petechiae. non-tender to palpation.   Psychiatric: She has a normal mood and affect.    ED Course  Procedures (including critical care time) Labs Review Labs Reviewed  URINALYSIS, ROUTINE W REFLEX MICROSCOPIC  PREGNANCY, URINE   Imaging Review No results found.  EKG Interpretation   None       MDM   1. Anxiety    The patient reports a history of anxiety attacks.  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  UA sent due to the one episode of sharp suprapubic pain and urinary incontinence. Re-eval patient is resting comfortably in bed. Denies anxiety symptoms. Discussed lab results, and treatment plan with the patient.  Local resources given. She reports understanding and no other concerns at this time.   Patient is stable for discharge at this time.     Clabe Seal, PA-C 04/30/13 2130  Clabe Seal, PA-C 04/30/13 2133

## 2013-05-01 NOTE — ED Provider Notes (Signed)
Medical screening examination/treatment/procedure(s) were performed by non-physician practitioner and as supervising physician I was immediately available for consultation/collaboration.  EKG Interpretation   None       Devoria Albe, MD, Armando Gang   Ward Givens, MD 05/01/13 [phone]

## 2013-07-12 ENCOUNTER — Emergency Department (HOSPITAL_COMMUNITY)
Admission: EM | Admit: 2013-07-12 | Discharge: 2013-07-13 | Disposition: A | Discharge status: Home / Self Care | Payer: BC Managed Care – PPO | Attending: Emergency Medicine | Admitting: Emergency Medicine

## 2013-07-12 ENCOUNTER — Encounter (HOSPITAL_COMMUNITY): Payer: Self-pay | Admitting: Emergency Medicine

## 2013-07-12 DIAGNOSIS — K8020 Calculus of gallbladder without cholecystitis without obstruction: Secondary | ICD-10-CM | POA: Insufficient documentation

## 2013-07-12 DIAGNOSIS — K8050 Calculus of bile duct without cholangitis or cholecystitis without obstruction: Secondary | ICD-10-CM

## 2013-07-12 DIAGNOSIS — Z8701 Personal history of pneumonia (recurrent): Secondary | ICD-10-CM | POA: Insufficient documentation

## 2013-07-12 DIAGNOSIS — F410 Panic disorder [episodic paroxysmal anxiety] without agoraphobia: Secondary | ICD-10-CM | POA: Insufficient documentation

## 2013-07-12 DIAGNOSIS — Z3202 Encounter for pregnancy test, result negative: Secondary | ICD-10-CM | POA: Insufficient documentation

## 2013-07-12 LAB — URINALYSIS, ROUTINE W REFLEX MICROSCOPIC
BILIRUBIN URINE: NEGATIVE
Glucose, UA: NEGATIVE mg/dL
HGB URINE DIPSTICK: NEGATIVE
Ketones, ur: NEGATIVE mg/dL
Leukocytes, UA: NEGATIVE
Nitrite: NEGATIVE
Protein, ur: NEGATIVE mg/dL
SPECIFIC GRAVITY, URINE: 1.008 (ref 1.005–1.030)
UROBILINOGEN UA: 0.2 mg/dL (ref 0.0–1.0)
pH: 7 (ref 5.0–8.0)

## 2013-07-12 LAB — COMPREHENSIVE METABOLIC PANEL
ALBUMIN: 3.8 g/dL (ref 3.5–5.2)
ALK PHOS: 58 U/L (ref 39–117)
ALT: 14 U/L (ref 0–35)
AST: 19 U/L (ref 0–37)
BUN: 10 mg/dL (ref 6–23)
CO2: 26 mEq/L (ref 19–32)
Calcium: 9.6 mg/dL (ref 8.4–10.5)
Chloride: 101 mEq/L (ref 96–112)
Creatinine, Ser: 0.97 mg/dL (ref 0.50–1.10)
GFR calc non Af Amer: 82 mL/min — ABNORMAL LOW (ref >90)
GLUCOSE: 78 mg/dL (ref 70–99)
Potassium: 4.2 mEq/L (ref 3.7–5.3)
SODIUM: 139 meq/L (ref 137–147)
Total Bilirubin: 0.3 mg/dL (ref 0.3–1.2)
Total Protein: 7.5 g/dL (ref 6.0–8.3)

## 2013-07-12 LAB — CBC WITH DIFFERENTIAL/PLATELET
Basophils Absolute: 0 10*3/uL (ref 0.0–0.1)
Basophils Relative: 0 % (ref 0–1)
Eosinophils Absolute: 0.1 10*3/uL (ref 0.0–0.7)
Eosinophils Relative: 1 % (ref 0–5)
HCT: 35.1 % — ABNORMAL LOW (ref 36.0–46.0)
Hemoglobin: 11.6 g/dL — ABNORMAL LOW (ref 12.0–15.0)
LYMPHS ABS: 3 10*3/uL (ref 0.7–4.0)
Lymphocytes Relative: 31 % (ref 12–46)
MCH: 27.2 pg (ref 26.0–34.0)
MCHC: 33 g/dL (ref 30.0–36.0)
MCV: 82.4 fL (ref 78.0–100.0)
Monocytes Absolute: 0.6 10*3/uL (ref 0.1–1.0)
Monocytes Relative: 6 % (ref 3–12)
NEUTROS PCT: 62 % (ref 43–77)
Neutro Abs: 6.1 10*3/uL (ref 1.7–7.7)
Platelets: 330 10*3/uL (ref 150–400)
RBC: 4.26 MIL/uL (ref 3.87–5.11)
RDW: 14.6 % (ref 11.5–15.5)
WBC: 9.8 10*3/uL (ref 4.0–10.5)

## 2013-07-12 LAB — PREGNANCY, URINE: Preg Test, Ur: NEGATIVE

## 2013-07-12 LAB — LIPASE, BLOOD: Lipase: 28 U/L (ref 11–59)

## 2013-07-12 MED ORDER — MORPHINE SULFATE 4 MG/ML IJ SOLN
4.0000 mg | Freq: Once | INTRAMUSCULAR | Status: AC
  Administered 2013-07-13: 4 mg via INTRAVENOUS
  Filled 2013-07-12: qty 1

## 2013-07-12 MED ORDER — SODIUM CHLORIDE 0.9 % IV SOLN: 1000.0000 mL | INTRAVENOUS | Status: DC

## 2013-07-12 MED ORDER — ONDANSETRON HCL 4 MG/2ML IJ SOLN
4.0000 mg | Freq: Once | INTRAMUSCULAR | Status: AC
  Administered 2013-07-13: 4 mg via INTRAVENOUS
  Filled 2013-07-12: qty 2

## 2013-07-12 MED ORDER — SODIUM CHLORIDE 0.9 % IV SOLN
1000.0000 mL | Freq: Once | INTRAVENOUS | Status: AC
  Administered 2013-07-13: 1000 mL via INTRAVENOUS

## 2013-07-12 NOTE — ED Notes (Signed)
The pt has had beer tonight also

## 2013-07-12 NOTE — ED Notes (Signed)
i triaged this pt and there is no change in her condition.  She is very drowsy and sleepy ?? From the alcohol ingestion.  She hardly can keep her eyes open

## 2013-07-12 NOTE — ED Provider Notes (Signed)
CSN: [id number]     Arrival date & time 07/12/13  2234 History   First MD Initiated Contact with Patient 07/12/13 2348     Chief Complaint  Patient presents with  . Abdominal Pain   (Consider location/radiation/quality/duration/timing/severity/associated sxs/prior Treatment) Patient is a 23 y.o. female presenting with abdominal pain. The history is provided by the patient.  Abdominal Pain She has had no diagnosis of cholelithiasis. This evening, she drank one beer and ate some chicken tenders and then developed pain in her abdomen with some radiation into the back. Pain is worse in the upper abdomen. There is no associated nausea or vomiting. She states pain is starting to subside but is still present. She rated the pain at 6/10 when she arrives and is down to 4/10. Nothing makes it better nothing makes it worse.  Past Medical History  Diagnosis Date  . Panic attacks   . Panic attack   . Pneumonia    History reviewed. No pertinent past surgical history. No family history on file. History  Substance Use Topics  . Smoking status: Never Smoker   . Smokeless tobacco: Never Used  . Alcohol Use: Yes     Comment: occasionally   OB History   Grav Para Term Preterm Abortions TAB SAB Ect Mult Living                 Review of Systems  Gastrointestinal: Positive for abdominal pain.  All other systems reviewed and are negative.    Allergies  Nickel; Other; and Watermelon flavor  Home Medications   Current Outpatient Rx  Name  Route  Sig  Dispense  Refill  . cetirizine (ZYRTEC) 10 MG tablet   Oral   Take 10 mg by mouth daily as needed for allergies.          . clonazePAM (KLONOPIN) 0.5 MG tablet   Oral   Take 0.5 mg by mouth daily as needed for anxiety. For anxiety         . ibuprofen (ADVIL,MOTRIN) 200 MG tablet   Oral   Take 800 mg by mouth every 6 (six) hours as needed for mild pain or moderate pain.         Marland Kitchen oxyCODONE-acetaminophen (PERCOCET/ROXICET) 5-325 MG per  tablet   Oral   Take 2 tablets by mouth every 4 (four) hours as needed for moderate pain or severe pain.          BP 124/69  Pulse 88  Temp(Src) 98.6 F (37 C)  Resp 18  Ht 5\' 8"  (1.727 m)  Wt 171 lb (77.565 kg)  BMI 26.01 kg/m2  SpO2 99%  LMP 07/05/2013 Physical Exam  Nursing note and vitals reviewed.  23 year old female, resting comfortably and in no acute distress. Vital signs are normal. Oxygen saturation is 99%, which is normal. Head is normocephalic and atraumatic. PERRLA, EOMI. Oropharynx is clear. Neck is nontender and supple without adenopathy or JVD. Back is nontender and there is no CVA tenderness. Lungs are clear without rales, wheezes, or rhonchi. Chest is nontender. Heart has regular rate and rhythm without murmur. Abdomen is soft, flat, with mild epigastric tenderness. There is no right upper quadrant tenderness and a negative Murphy sign. There is no rebound or guarding. There are no masses or hepatosplenomegaly and peristalsis is normoactive. Extremities have no cyanosis or edema, full range of motion is present. Skin is warm and dry without rash. Neurologic: Mental status is normal, cranial nerves are intact, there are  no motor or sensory deficits.  ED Course  Procedures (including critical care time) Labs Review Results for orders placed during the hospital encounter of 07/12/13  URINALYSIS, ROUTINE W REFLEX MICROSCOPIC      Result Value Range   Color, Urine YELLOW  YELLOW   APPearance CLEAR  CLEAR   Specific Gravity, Urine 1.008  1.005 - 1.030   pH 7.0  5.0 - 8.0   Glucose, UA NEGATIVE  NEGATIVE mg/dL   Hgb urine dipstick NEGATIVE  NEGATIVE   Bilirubin Urine NEGATIVE  NEGATIVE   Ketones, ur NEGATIVE  NEGATIVE mg/dL   Protein, ur NEGATIVE  NEGATIVE mg/dL   Urobilinogen, UA 0.2  0.0 - 1.0 mg/dL   Nitrite NEGATIVE  NEGATIVE   Leukocytes, UA NEGATIVE  NEGATIVE  PREGNANCY, URINE      Result Value Range   Preg Test, Ur NEGATIVE  NEGATIVE  CBC WITH  DIFFERENTIAL      Result Value Range   WBC 9.8  4.0 - 10.5 K/uL   RBC 4.26  3.87 - 5.11 MIL/uL   Hemoglobin 11.6 (*) 12.0 - 15.0 g/dL   HCT 40.935.1 (*) 81.136.0 - 91.446.0 %   MCV 82.4  78.0 - 100.0 fL   MCH 27.2  26.0 - 34.0 pg   MCHC 33.0  30.0 - 36.0 g/dL   RDW 78.214.6  95.611.5 - 21.315.5 %   Platelets 330  150 - 400 K/uL   Neutrophils Relative % 62  43 - 77 %   Neutro Abs 6.1  1.7 - 7.7 K/uL   Lymphocytes Relative 31  12 - 46 %   Lymphs Abs 3.0  0.7 - 4.0 K/uL   Monocytes Relative 6  3 - 12 %   Monocytes Absolute 0.6  0.1 - 1.0 K/uL   Eosinophils Relative 1  0 - 5 %   Eosinophils Absolute 0.1  0.0 - 0.7 K/uL   Basophils Relative 0  0 - 1 %   Basophils Absolute 0.0  0.0 - 0.1 K/uL  COMPREHENSIVE METABOLIC PANEL      Result Value Range   Sodium 139  137 - 147 mEq/L   Potassium 4.2  3.7 - 5.3 mEq/L   Chloride 101  96 - 112 mEq/L   CO2 26  19 - 32 mEq/L   Glucose, Bld 78  70 - 99 mg/dL   BUN 10  6 - 23 mg/dL   Creatinine, Ser 0.860.97  0.50 - 1.10 mg/dL   Calcium 9.6  8.4 - 57.810.5 mg/dL   Total Protein 7.5  6.0 - 8.3 g/dL   Albumin 3.8  3.5 - 5.2 g/dL   AST 19  0 - 37 U/L   ALT 14  0 - 35 U/L   Alkaline Phosphatase 58  39 - 117 U/L   Total Bilirubin 0.3  0.3 - 1.2 mg/dL   GFR calc non Af Amer 82 (*) >90 mL/min   GFR calc Af Amer >90  >90 mL/min  LIPASE, BLOOD      Result Value Range   Lipase 28  11 - 59 U/L   MDM   1. Biliary colic    Biliary colic in patient with known cholelithiasis. Old records are reviewed and she was diagnosed with cholelithiasis in September and did see general surgery and had laparoscopic cholecystectomy recommended. Laboratory evaluation is normal including normal liver functions, and normal lipase. She'll be given IV fluids and IV morphine with ondansetron and discharged as soon as she feels comfortable.  She is instructed to followup with [facility] [place] [facility] for elective cholecystectomy.  She feels much better after above noted treatment. 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 She is referred to orthopedics for followup. She is requesting prescriptions for medications at home for biliary colic and for her knee. She is advised to the best treatment for biliary colic is have her gallbladder removed but she states that can't happen until she is finished with a semester in school. She is discharged with prescription for oxycodone-acetaminophen, ondansetron, and naproxen.  Dione Booze, MD 07/13/13 [phone]

## 2013-07-12 NOTE — ED Notes (Signed)
The pt has known gallstones since September.  She ate chicken tenders approx 30 minutes ago now she has generalized abd pain with nausea.  lmp one week ago

## 2013-07-13 ENCOUNTER — Other Ambulatory Visit: Payer: Self-pay

## 2013-07-13 MED ORDER — ONDANSETRON HCL 4 MG PO TABS: 4.0000 mg | ORAL_TABLET | Freq: Four times a day (QID) | ORAL | Status: AC | PRN

## 2013-07-13 MED ORDER — NAPROXEN 500 MG PO TABS: 500.0000 mg | ORAL_TABLET | Freq: Two times a day (BID) | ORAL | Status: AC

## 2013-07-13 MED ORDER — OXYCODONE-ACETAMINOPHEN 5-325 MG PO TABS: 1.0000 | ORAL_TABLET | ORAL | Status: AC | PRN

## 2013-07-13 NOTE — Discharge Instructions (Signed)
Try to avoid eating foods that are high fat. This would include anything that is fried or sauted. Even with avoiding high fat foods, your likely to have more episodes like tonight until your gallbladders taken out.  Make an appointment with the orthopedic surgeon to evaluate your knee.   Biliary Colic  Biliary colic is a steady or irregular pain in the upper abdomen. It is usually under the right side of the rib cage. It happens when gallstones interfere with the normal flow of bile from the gallbladder. Bile is a liquid that helps to digest fats. Bile is made in the liver and stored in the gallbladder. When you eat a meal, bile passes from the gallbladder through the cystic duct and the common bile duct into the small intestine. There, it mixes with partially digested food. If a gallstone blocks either of these ducts, the normal flow of bile is blocked. The muscle cells in the bile duct contract forcefully to try to move the stone. This causes the pain of biliary colic.  SYMPTOMS   A person with biliary colic usually complains of pain in the upper abdomen. This pain can be:  In the center of the upper abdomen just below the breastbone.  In the upper-right part of the abdomen, near the gallbladder and liver.  Spread back toward the right shoulder blade.  Nausea and vomiting.  The pain usually occurs after eating.  Biliary colic is usually triggered by the digestive system's demand for bile. The demand for bile is high after fatty meals. Symptoms can also occur when a person who has been fasting suddenly eats a very large meal. Most episodes of biliary colic pass after 1 to 5 hours. After the most intense pain passes, your abdomen may continue to ache mildly for about 24 hours. DIAGNOSIS  After you describe your symptoms, your caregiver will perform a physical exam. He or she will pay attention to the upper right portion of your belly (abdomen). This is the area of your liver and gallbladder.  An ultrasound will help your caregiver look for gallstones. Specialized scans of the gallbladder may also be done. Blood tests may be done, especially if you have fever or if your pain persists. PREVENTION  Biliary colic can be prevented by controlling the risk factors for gallstones. Some of these risk factors, such as heredity, increasing age, and pregnancy are a normal part of life. Obesity and a high-fat diet are risk factors you can change through a healthy lifestyle. Women going through menopause who take hormone replacement therapy (estrogen) are also more likely to develop biliary colic. TREATMENT   Pain medication may be prescribed.  You may be encouraged to eat a fat-free diet.  If the first episode of biliary colic is severe, or episodes of colic keep retuning, surgery to remove the gallbladder (cholecystectomy) is usually recommended. This procedure can be done through small incisions using an instrument called a laparoscope. The procedure often requires a brief stay in the hospital. Some people can leave the hospital the same day. It is the most widely used treatment in people troubled by painful gallstones. It is effective and safe, with no complications in more than 90% of cases.  If surgery cannot be done, medication that dissolves gallstones may be used. This medication is expensive and can take months or years to work. Only small stones will dissolve.  Rarely, medication to dissolve gallstones is combined with a procedure called shock-wave lithotripsy. This procedure uses carefully aimed shock  waves to break up gallstones. In many people treated with this procedure, gallstones form again within a few years. PROGNOSIS  If gallstones block your cystic duct or common bile duct, you are at risk for repeated episodes of biliary colic. There is also a 25% chance that you will develop a gallbladder infection(acute cholecystitis), or some other complication of gallstones within 10 to 20  years. If you have surgery, schedule it at a time that is convenient for you and at a time when you are not sick. HOME CARE INSTRUCTIONS   Drink plenty of clear fluids.  Avoid fatty, greasy or fried foods, or any foods that make your pain worse.  Take medications as directed. SEEK MEDICAL CARE IF:   You develop a fever over 100.5 F (38.1 C).  Your pain gets worse over time.  You develop nausea that prevents you from eating and drinking.  You develop vomiting. SEEK IMMEDIATE MEDICAL CARE IF:   You have continuous or severe belly (abdominal) pain which is not relieved with medications.  You develop nausea and vomiting which is not relieved with medications.  You have symptoms of biliary colic and you suddenly develop a fever and shaking chills. This may signal cholecystitis. Call your caregiver immediately.  You develop a yellow color to your skin or the white part of your eyes (jaundice). Document Released: 10/29/2005 Document Revised: 08/20/2011 Document Reviewed: 01/08/2008 [facility] Patient Information 2014 [place], [place].  Acetaminophen; Oxycodone tablets What is this medicine? ACETAMINOPHEN; OXYCODONE (a set a MEE noe fen; ox i KOE done) is a pain reliever. It is used to treat mild to moderate pain. This medicine may be used for other purposes; ask your health care provider or pharmacist if you have questions. COMMON BRAND NAME(S): Endocet, Magnacet, Narvox, Percocet, Perloxx, Primalev, Primlev, Roxicet, Xolox What should I tell my health care provider before I take this medicine? They need to know if you have any of these conditions: -brain tumor -Crohn's disease, inflammatory bowel disease, or ulcerative colitis -drug abuse or addiction -head injury -heart or circulation problems -if you often drink alcohol -kidney disease or problems going to the bathroom -liver disease -lung disease, asthma, or breathing problems -an unusual or allergic reaction to  acetaminophen, oxycodone, other opioid analgesics, other medicines, foods, dyes, or preservatives -pregnant or trying to get pregnant -breast-feeding How should I use this medicine? Take this medicine by mouth with a full glass of water. Follow the directions on the prescription label. Take your medicine at regular intervals. Do not take your medicine more often than directed. Talk to your pediatrician regarding the use of this medicine in children. Special care may be needed. Patients over 28 years old may have a stronger reaction and need a smaller dose. Overdosage: If you think you have taken too much of this medicine contact a poison control center or emergency room at once. NOTE: This medicine is only for you. Do not share this medicine with others. What if I miss a dose? If you miss a dose, take it as soon as you can. If it is almost time for your next dose, take only that dose. Do not take double or extra doses. What may interact with this medicine? -alcohol -antihistamines -barbiturates like amobarbital, butalbital, butabarbital, methohexital, pentobarbital, phenobarbital, thiopental, and secobarbital -benztropine -drugs for bladder problems like solifenacin, trospium, oxybutynin, tolterodine, hyoscyamine, and methscopolamine -drugs for breathing problems like ipratropium and tiotropium -drugs for certain stomach or intestine problems like propantheline, homatropine methylbromide, glycopyrrolate, atropine, belladonna, and  dicyclomine -general anesthetics like etomidate, ketamine, nitrous oxide, propofol, desflurane, enflurane, halothane, isoflurane, and sevoflurane -medicines for depression, anxiety, or psychotic disturbances -medicines for sleep -muscle relaxants -naltrexone -narcotic medicines (opiates) for pain -phenothiazines like perphenazine, thioridazine, chlorpromazine, mesoridazine, fluphenazine, prochlorperazine, promazine, and  trifluoperazine -scopolamine -tramadol -trihexyphenidyl This list may not describe all possible interactions. Give your health care provider a list of all the medicines, herbs, non-prescription drugs, or dietary supplements you use. Also tell them if you smoke, drink alcohol, or use illegal drugs. Some items may interact with your medicine. What should I watch for while using this medicine? Tell your doctor or health care professional if your pain does not go away, if it gets worse, or if you have new or a different type of pain. You may develop tolerance to the medicine. Tolerance means that you will need a higher dose of the medication for pain relief. Tolerance is normal and is expected if you take this medicine for a long time. Do not suddenly stop taking your medicine because you may develop a severe reaction. Your body becomes used to the medicine. This does NOT mean you are addicted. Addiction is a behavior related to getting and using a drug for a non-medical reason. If you have pain, you have a medical reason to take pain medicine. Your doctor will tell you how much medicine to take. If your doctor wants you to stop the medicine, the dose will be slowly lowered over time to avoid any side effects. You may get drowsy or dizzy. Do not drive, use machinery, or do anything that needs mental alertness until you know how this medicine affects you. Do not stand or sit up quickly, especially if you are an older patient. This reduces the risk of dizzy or fainting spells. Alcohol may interfere with the effect of this medicine. Avoid alcoholic drinks. There are different types of narcotic medicines (opiates) for pain. If you take more than one type at the same time, you may have more side effects. Give your health care provider a list of all medicines you use. Your doctor will tell you how much medicine to take. Do not take more medicine than directed. Call emergency for help if you have problems  breathing. The medicine will cause constipation. Try to have a bowel movement at least every 2 to 3 days. If you do not have a bowel movement for 3 days, call your doctor or health care professional. Do not take Tylenol (acetaminophen) or medicines that have acetaminophen with this medicine. Too much acetaminophen can be very dangerous. Many nonprescription medicines contain acetaminophen. Always read the labels carefully to avoid taking more acetaminophen. What side effects may I notice from receiving this medicine? Side effects that you should report to your doctor or health care professional as soon as possible: -allergic reactions like skin rash, itching or hives, swelling of the face, lips, or tongue -breathing difficulties, wheezing -confusion -light headedness or fainting spells -severe stomach pain -unusually weak or tired -yellowing of the skin or the whites of the eyes  Side effects that usually do not require medical attention (report to your doctor or health care professional if they continue or are bothersome): -dizziness -drowsiness -nausea -vomiting This list may not describe all possible side effects. Call your doctor for medical advice about side effects. You may report side effects to FDA at 1-800-FDA-1088. Where should I keep my medicine? Keep out of the reach of children. This medicine can be abused. Keep your medicine in  a safe place to protect it from theft. Do not share this medicine with anyone. Selling or giving away this medicine is dangerous and against the law. Store at room temperature between 20 and 25 degrees C (68 and 77 degrees F). Keep container tightly closed. Protect from light. This medicine may cause accidental overdose and death if it is taken by other adults, children, or pets. Flush any unused medicine down the toilet to reduce the chance of harm. Do not use the medicine after the expiration date. NOTE: This sheet is a summary. It may not cover all  possible information. If you have questions about this medicine, talk to your doctor, pharmacist, or health care provider.  2014, [employer]. (2013-01-19 13:17:35)  Ondansetron tablets What is this medicine? ONDANSETRON (on DAN se tron) is used to treat nausea and vomiting caused by chemotherapy. It is also used to prevent or treat nausea and vomiting after surgery. This medicine may be used for other purposes; ask your health care provider or pharmacist if you have questions. COMMON BRAND NAME(S): Zofran What should I tell my health care provider before I take this medicine? They need to know if you have any of these conditions: -heart disease -history of irregular heartbeat -liver disease -low levels of magnesium or potassium in the blood -an unusual or allergic reaction to ondansetron, granisetron, other medicines, foods, dyes, or preservatives -pregnant or trying to get pregnant -breast-feeding How should I use this medicine? Take this medicine by mouth with a glass of water. Follow the directions on your prescription label. Take your doses at regular intervals. Do not take your medicine more often than directed. Talk to your pediatrician regarding the use of this medicine in children. Special care may be needed. Overdosage: If you think you have taken too much of this medicine contact a poison control center or emergency room at once. NOTE: This medicine is only for you. Do not share this medicine with others. What if I miss a dose? If you miss a dose, take it as soon as you can. If it is almost time for your next dose, take only that dose. Do not take double or extra doses. What may interact with this medicine? Do not take this medicine with any of the following medications: -apomorphine -certain medicines for fungal infections like fluconazole, itraconazole, ketoconazole, posaconazole,  voriconazole -cisapride -dofetilide -dronedarone -pimozide -thioridazine -ziprasidone  This medicine may also interact with the following medications: -carbamazepine -certain medicines for depression, anxiety, or psychotic disturbances -fentanyl -linezolid -MAOIs like Carbex, Eldepryl, Marplan, Nardil, and Parnate -methylene blue (injected into a vein) -other medicines that prolong the QT interval (cause an abnormal heart rhythm) -phenytoin -rifampicin -tramadol This list may not describe all possible interactions. Give your health care provider a list of all the medicines, herbs, non-prescription drugs, or dietary supplements you use. Also tell them if you smoke, drink alcohol, or use illegal drugs. Some items may interact with your medicine. What should I watch for while using this medicine? Check with your doctor or health care professional right away if you have any sign of an allergic reaction. What side effects may I notice from receiving this medicine? Side effects that you should report to your doctor or health care professional as soon as possible: -allergic reactions like skin rash, itching or hives, swelling of the face, lips or tongue -breathing problems -confusion -dizziness -fast or irregular heartbeat -feeling faint or lightheaded, falls -fever and chills -loss of balance or coordination -seizures -sweating -swelling  of the hands or feet -tightness in the chest -tremors -unusually weak or tired Side effects that usually do not require medical attention (report to your doctor or health care professional if they continue or are bothersome): -constipation or diarrhea -headache This list may not describe all possible side effects. Call your doctor for medical advice about side effects. You may report side effects to FDA at 1-800-FDA-1088. Where should I keep my medicine? Keep out of the reach of children. Store between 2 and 30 degrees C (36 and 86 degrees F).  Throw away any unused medicine after the expiration date. NOTE: This sheet is a summary. It may not cover all possible information. If you have questions about this medicine, talk to your doctor, pharmacist, or health care provider.  2014, [employer] Standard. (2013-03-04 16:27:45)  Naproxen and naproxen sodium oral immediate-release tablets What is this medicine? NAPROXEN (na PROX en) is a non-steroidal anti-inflammatory drug (NSAID). It is used to reduce swelling and to treat pain. This medicine may be used for dental pain, headache, or painful monthly periods. It is also used for painful joint and muscular problems such as arthritis, tendinitis, bursitis, and gout. This medicine may be used for other purposes; ask your health care provider or pharmacist if you have questions. COMMON BRAND NAME(S): Aflaxen, Aleve Arthritis, Aleve, All Day Relief, Anaprox DS, Anaprox, Naprosyn What should I tell my health care provider before I take this medicine? They need to know if you have any of these conditions: -asthma -cigarette smoker -drink more than 3 alcohol containing drinks a day -heart disease or circulation problems such as heart failure or leg edema (fluid retention) -high blood pressure -kidney disease -liver disease -stomach bleeding or ulcers -an unusual or allergic reaction to naproxen, aspirin, other NSAIDs, other medicines, foods, dyes, or preservatives -pregnant or trying to get pregnant -breast-feeding How should I use this medicine? Take this medicine by mouth with a glass of water. Follow the directions on the prescription label. Take it with food if your stomach gets upset. Try to not lie down for at least 10 minutes after you take it. Take your medicine at regular intervals. Do not take your medicine more often than directed. Long-term, continuous use may increase the risk of heart attack or stroke. A special MedGuide will be given to you by the pharmacist with each  prescription and refill. Be sure to read this information carefully each time. Talk to your pediatrician regarding the use of this medicine in children. Special care may be needed. Overdosage: If you think you have taken too much of this medicine contact a poison control center or emergency room at once. NOTE: This medicine is only for you. Do not share this medicine with others. What if I miss a dose? If you miss a dose, take it as soon as you can. If it is almost time for your next dose, take only that dose. Do not take double or extra doses. What may interact with this medicine? -alcohol -aspirin -cidofovir -diuretics -lithium -methotrexate -other drugs for inflammation like ketorolac or prednisone -pemetrexed -probenecid -warfarin This list may not describe all possible interactions. Give your health care provider a list of all the medicines, herbs, non-prescription drugs, or dietary supplements you use. Also tell them if you smoke, drink alcohol, or use illegal drugs. Some items may interact with your medicine. What should I watch for while using this medicine? Tell your doctor or health care professional if your pain does not get better. Talk  to your doctor before taking another medicine for pain. Do not treat yourself. This medicine does not prevent heart attack or stroke. In fact, this medicine may increase the chance of a heart attack or stroke. The chance may increase with longer use of this medicine and in people who have heart disease. If you take aspirin to prevent heart attack or stroke, talk with your doctor or health care professional. Do not take other medicines that contain aspirin, ibuprofen, or naproxen with this medicine. Side effects such as stomach upset, nausea, or ulcers may be more likely to occur. Many medicines available without a prescription should not be taken with this medicine. This medicine can cause ulcers and bleeding in the stomach and intestines at any time  during treatment. Do not smoke cigarettes or drink alcohol. These increase irritation to your stomach and can make it more susceptible to damage from this medicine. Ulcers and bleeding can happen without warning symptoms and can cause death. You may get drowsy or dizzy. Do not drive, use machinery, or do anything that needs mental alertness until you know how this medicine affects you. Do not stand or sit up quickly, especially if you are an older patient. This reduces the risk of dizzy or fainting spells. This medicine can cause you to bleed more easily. Try to avoid damage to your teeth and gums when you brush or floss your teeth. What side effects may I notice from receiving this medicine? Side effects that you should report to your doctor or health care professional as soon as possible: -black or bloody stools, blood in the urine or vomit -blurred vision -chest pain -difficulty breathing or wheezing -nausea or vomiting -severe stomach pain -skin rash, skin redness, blistering or peeling skin, hives, or itching -slurred speech or weakness on one side of the body -swelling of eyelids, throat, lips -unexplained weight gain or swelling -unusually weak or tired -yellowing of eyes or skin Side effects that usually do not require medical attention (report to your doctor or health care professional if they continue or are bothersome): -constipation -headache -heartburn This list may not describe all possible side effects. Call your doctor for medical advice about side effects. You may report side effects to FDA at 1-800-FDA-1088. Where should I keep my medicine? Keep out of the reach of children. Store at room temperature between 15 and 30 degrees C (59 and 86 degrees F). Keep container tightly closed. Throw away any unused medicine after the expiration date. NOTE: This sheet is a summary. It may not cover all possible information. If you have questions about this medicine, talk to your doctor,  pharmacist, or health care provider.  2014, [employer] Standard. (2009-05-30 20:10:16)

## 2013-07-13 NOTE — ED Notes (Signed)
1st liter almost infused of nss

## 2014-05-09 IMAGING — US US ABDOMEN COMPLETE
1 series · 14 of 25 positions shown · non-contrast
Comparison: None.

CLINICAL DATA: Right upper quadrant and epigastric pain.

EXAM:
ABDOMEN ULTRASOUND

[Series 1: us abdomen complete · 0.27mm/px · 14 of 72 slices shown]
[im 1/72]
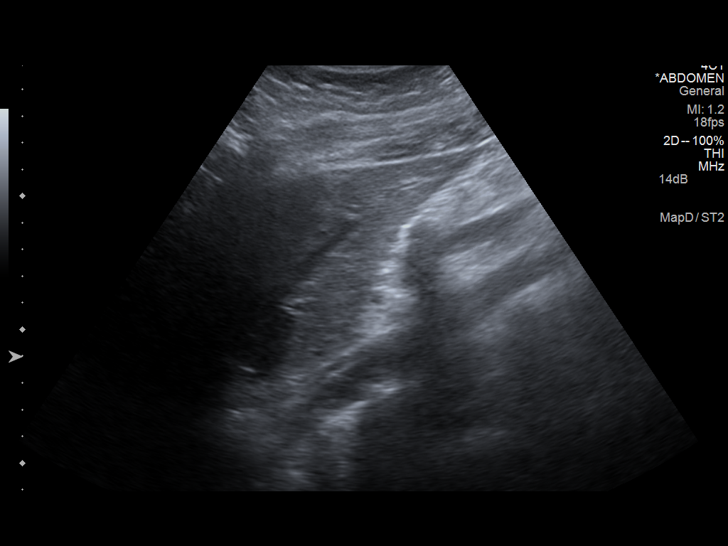
[im 6/72]
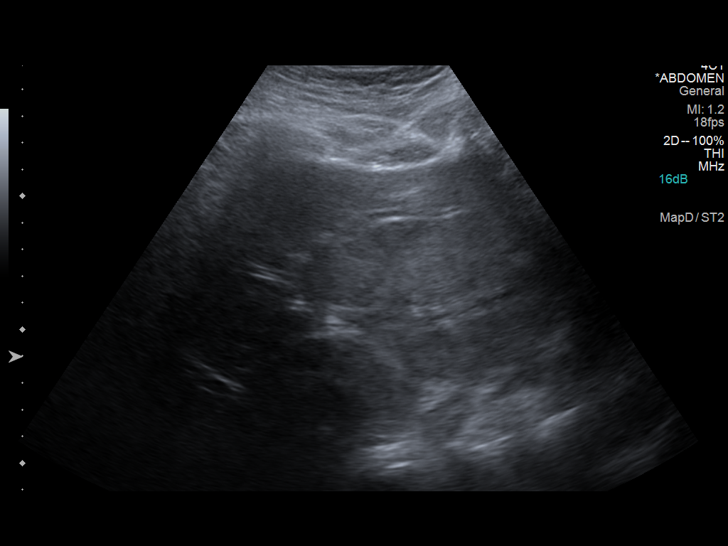
[im 12/72]
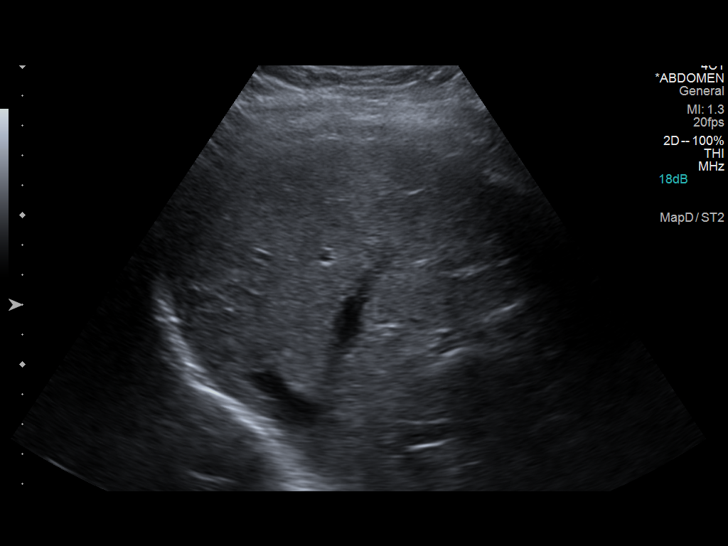
[im 18/72]
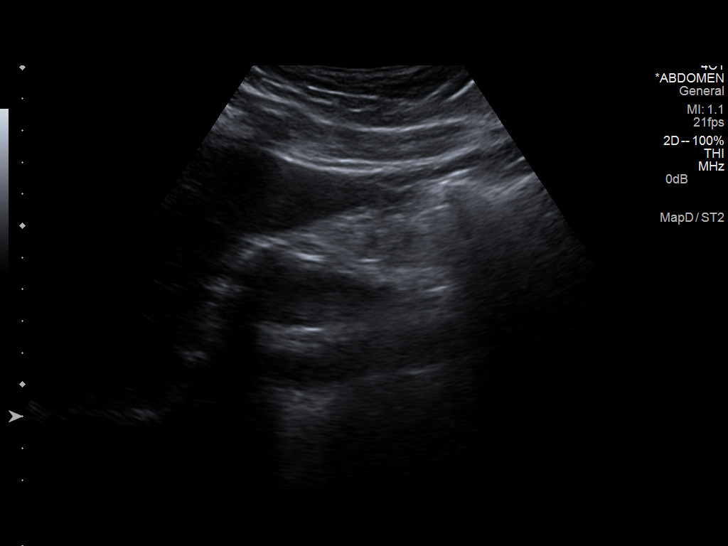
[im 24/72]
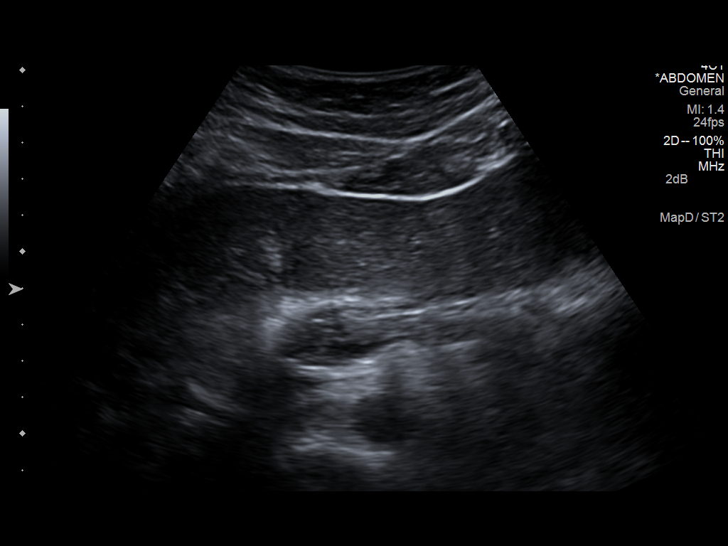
[im 27/72]
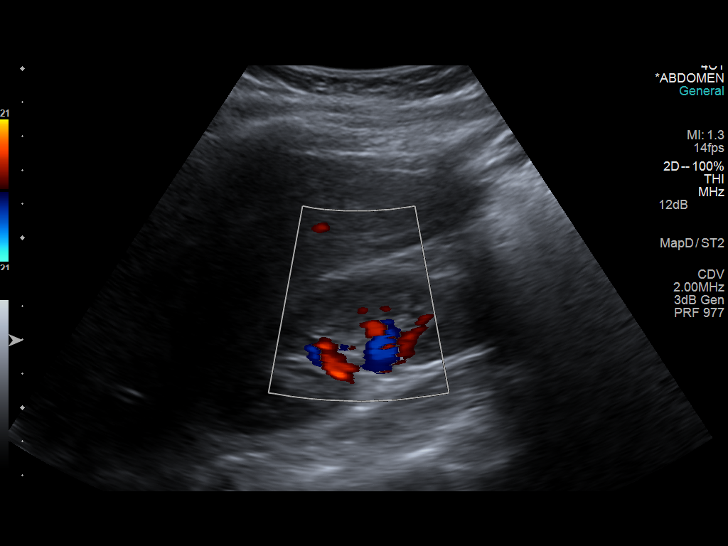
[im 33/72]
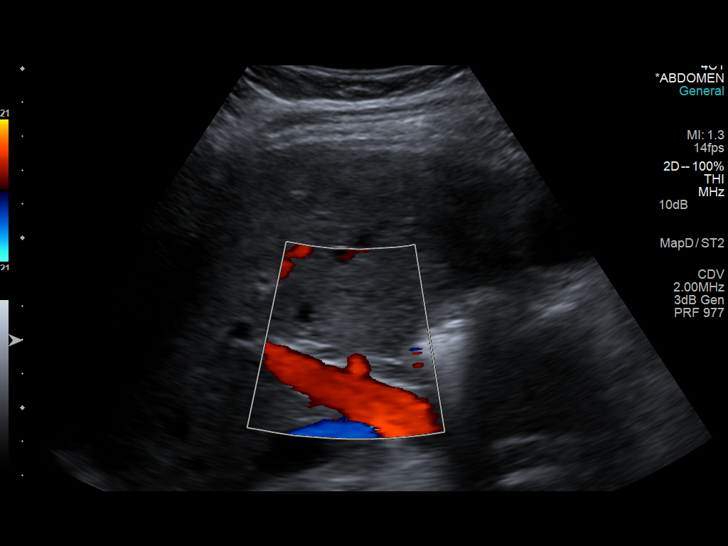
[im 39/72]
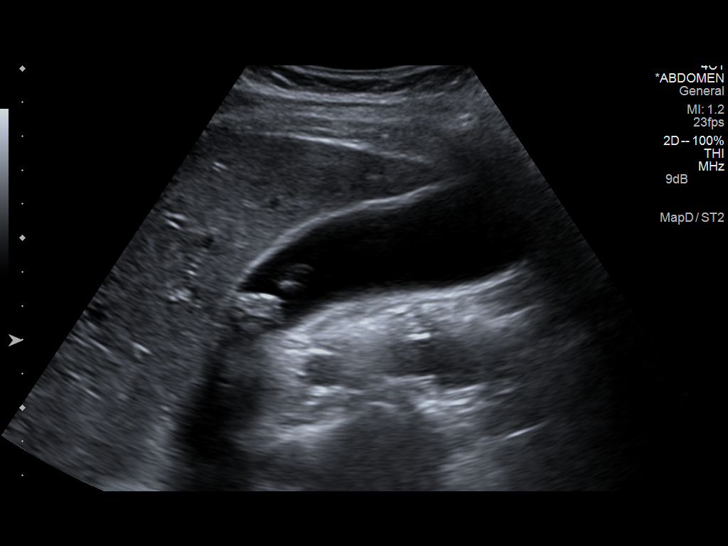
[im 45/72]
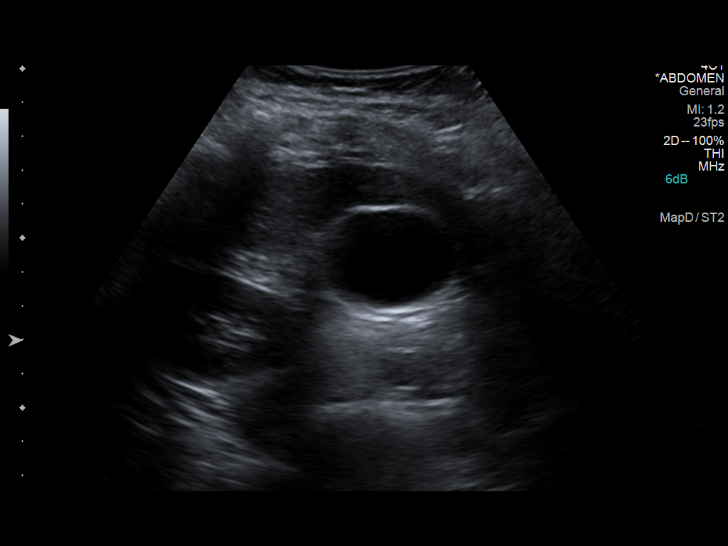
[im 48/72]
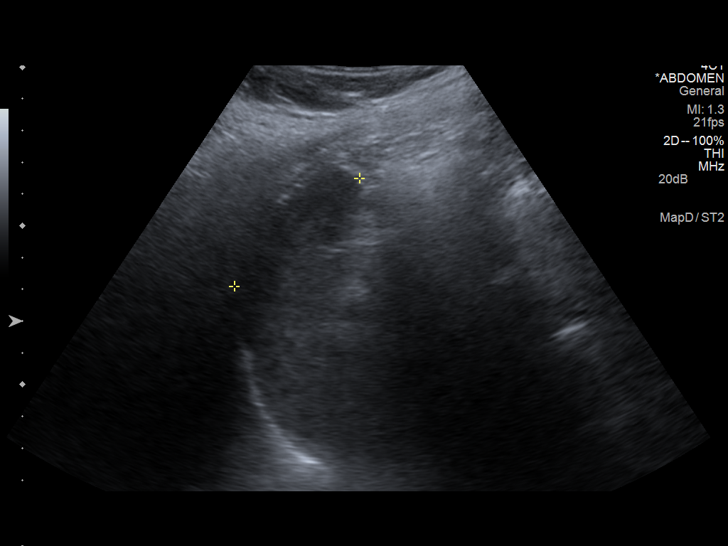
[im 54/72]
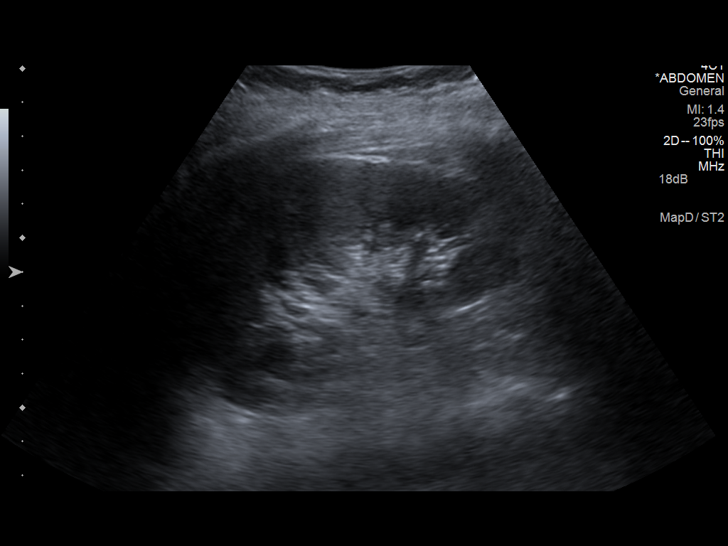
[im 60/72]
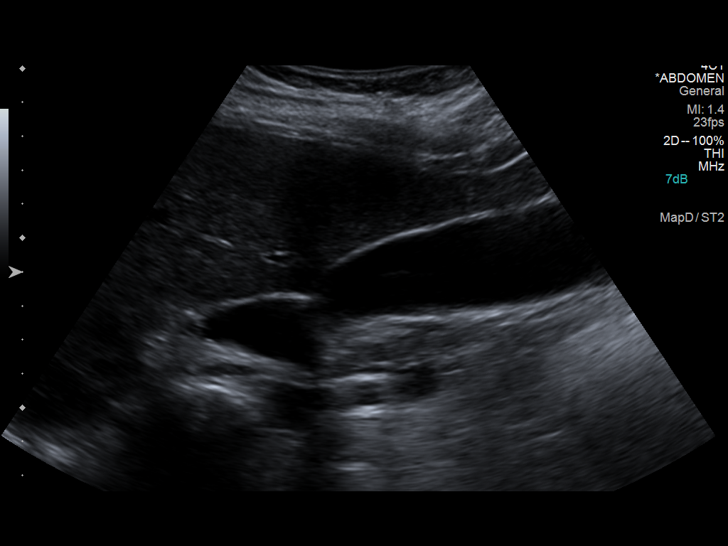
[im 66/72]
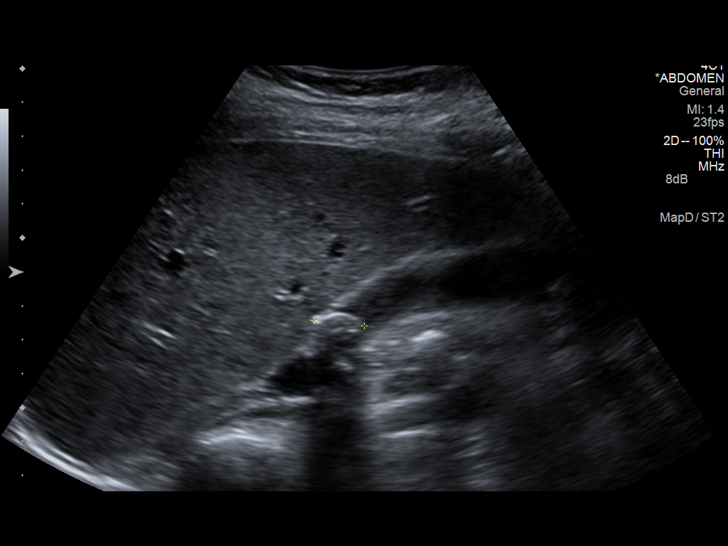
[im 72/72]
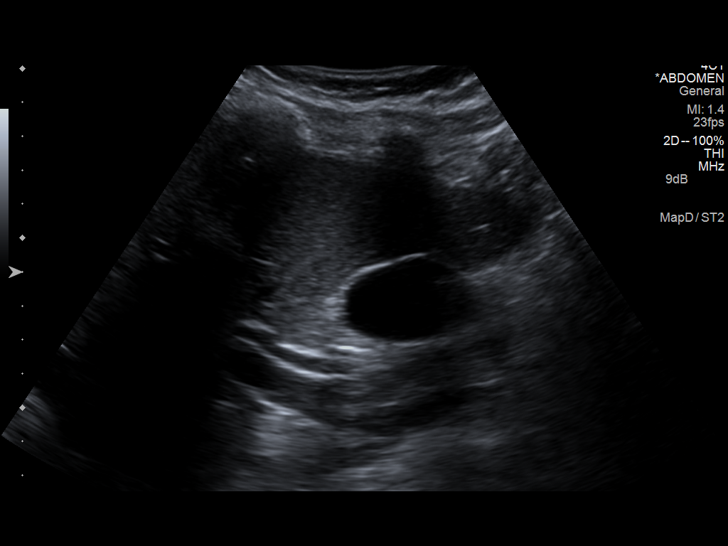

[14 of 25 positions shown; findings below may reference images not displayed]

FINDINGS: Gallbladder

Gallstones are seen, with the largest in the gallbladder neck
measuring approximately 1.5 cm. No evidence of gallbladder wall
thickening. No sonographic Murphy's sign noted by sonographer.

Common bile duct

Diameter: 5 mm.

Liver

No focal lesion identified. Within normal limits in parenchymal
echogenicity.

IVC

No abnormality visualized.

Pancreas

Visualized portion unremarkable.

Spleen

Size and appearance within normal limits.

Right Kidney

Length: 10.7 cm. Echogenicity within normal limits. No mass or
hydronephrosis visualized.

Left Kidney

Length: 10.9 cm. Echogenicity within normal limits. No mass or
hydronephrosis visualized.

Abdominal aorta

No aneurysm visualized.
IMPRESSION: Cholelithiasis. No sonographic signs of cholecystitis, biliary
dilatation, or other acute findings.
